# Patient Record
Sex: Female | Born: 1955 | Race: White | Hispanic: No | Smoking: Never smoker
Health system: Southern US, Community
[De-identification: ages and names within clinical notes are randomized; demographics above are authoritative.]

## PROBLEM LIST (undated history)

## (undated) DIAGNOSIS — K219 Gastro-esophageal reflux disease without esophagitis: Secondary | ICD-10-CM

## (undated) DIAGNOSIS — E119 Type 2 diabetes mellitus without complications: Secondary | ICD-10-CM

## (undated) DIAGNOSIS — M8580 Other specified disorders of bone density and structure, unspecified site: Secondary | ICD-10-CM

## (undated) DIAGNOSIS — F419 Anxiety disorder, unspecified: Secondary | ICD-10-CM

## (undated) DIAGNOSIS — E079 Disorder of thyroid, unspecified: Secondary | ICD-10-CM

---

## 2020-04-13 ENCOUNTER — Other Ambulatory Visit: Payer: Self-pay

## 2020-04-13 ENCOUNTER — Encounter: Payer: Self-pay | Admitting: Emergency Medicine

## 2020-04-13 ENCOUNTER — Emergency Department (INDEPENDENT_AMBULATORY_CARE_PROVIDER_SITE_OTHER)
Admission: EM | Admit: 2020-04-13 | Discharge: 2020-04-13 | Disposition: A | Discharge status: Home / Self Care | Payer: BC Managed Care – PPO | Source: Home / Self Care | Attending: Family Medicine | Admitting: Family Medicine

## 2020-04-13 ENCOUNTER — Emergency Department (INDEPENDENT_AMBULATORY_CARE_PROVIDER_SITE_OTHER): Payer: BC Managed Care – PPO

## 2020-04-13 DIAGNOSIS — M25531 Pain in right wrist: Secondary | ICD-10-CM

## 2020-04-13 DIAGNOSIS — E1140 Type 2 diabetes mellitus with diabetic neuropathy, unspecified: Secondary | ICD-10-CM | POA: Insufficient documentation

## 2020-04-13 DIAGNOSIS — W19XXXA Unspecified fall, initial encounter: Secondary | ICD-10-CM | POA: Diagnosis not present

## 2020-04-13 DIAGNOSIS — E039 Hypothyroidism, unspecified: Secondary | ICD-10-CM | POA: Insufficient documentation

## 2020-04-13 DIAGNOSIS — E538 Deficiency of other specified B group vitamins: Secondary | ICD-10-CM | POA: Insufficient documentation

## 2020-04-13 DIAGNOSIS — E119 Type 2 diabetes mellitus without complications: Secondary | ICD-10-CM | POA: Insufficient documentation

## 2020-04-13 DIAGNOSIS — E785 Hyperlipidemia, unspecified: Secondary | ICD-10-CM | POA: Insufficient documentation

## 2020-04-13 DIAGNOSIS — S52571A Other intraarticular fracture of lower end of right radius, initial encounter for closed fracture: Secondary | ICD-10-CM

## 2020-04-13 DIAGNOSIS — F329 Major depressive disorder, single episode, unspecified: Secondary | ICD-10-CM | POA: Insufficient documentation

## 2020-04-13 DIAGNOSIS — U071 COVID-19: Secondary | ICD-10-CM | POA: Insufficient documentation

## 2020-04-13 HISTORY — DX: Gastro-esophageal reflux disease without esophagitis: K21.9

## 2020-04-13 HISTORY — DX: Anxiety disorder, unspecified: F41.9

## 2020-04-13 HISTORY — DX: Type 2 diabetes mellitus without complications: E11.9

## 2020-04-13 HISTORY — DX: Other specified disorders of bone density and structure, unspecified site: M85.80

## 2020-04-13 HISTORY — DX: Disorder of thyroid, unspecified: E07.9

## 2020-04-13 MED ORDER — IBUPROFEN 600 MG PO TABS
600.0000 mg | ORAL_TABLET | Freq: Once | ORAL | Status: AC
  Administered 2020-04-13: 600 mg via ORAL

## 2020-04-13 MED ORDER — HYDROCODONE-ACETAMINOPHEN 7.5-325 MG PO TABS: 1.0000 | ORAL_TABLET | Freq: Four times a day (QID) | ORAL | 0 refills | Status: AC | PRN

## 2020-04-13 NOTE — Discharge Instructions (Signed)
Use ice for 20 minutes every couple of hours and elevate arm This will help reduce pain and swelling  Take Tylenol or ibuprofen or naproxen for mild to moderate pain Take hydrocodone if pain is severe  Leave on brace.  Wear sling as much as you can Call your orthopedic on Monday morning for an appointment next week

## 2020-04-13 NOTE — ED Provider Notes (Signed)
[facility]    CSN: [id number] Arrival date & time: 04/13/20  1600      History   Chief Complaint Chief Complaint  Patient presents with  . Wrist Pain    HPI Audrey Mccarty is a 64 y.o. female.   HPI  Patient fell backwards.  Landed on outstretched hand.  Has acute pain in the right wrist.  Is here for evaluation.  It is swollen.  Past Medical History:  Diagnosis Date  . Anxiety   . Diabetes mellitus without complication (HCC)   . GERD (gastroesophageal reflux disease)   . Osteopenia   . Thyroid disease     Patient Active Problem List   Diagnosis Date Noted  . Type 2 diabetes mellitus (HCC) 04/13/2020  . Acquired hypothyroidism 04/13/2020  . Diabetic neuropathy (HCC) 04/13/2020  . B12 deficiency 04/13/2020  . Major depression 04/13/2020  . Hyperlipidemia 04/13/2020  . Pneumonia due to COVID-19 virus 04/13/2020    History reviewed. No pertinent surgical history.  OB History   No obstetric history on file.      Home Medications    Prior to Admission medications   Medication Sig Start Date End Date Taking? Authorizing Provider  aspirin EC 81 MG tablet Take 81 mg by mouth daily. Swallow whole.   Yes [provider]  FLUoxetine (PROZAC) 20 MG capsule Take 20 mg by mouth daily.   Yes [provider]  HYDROcodone-acetaminophen (NORCO) 7.5-325 MG tablet Take 1 tablet by mouth every 6 (six) hours as needed for moderate pain or severe pain. 04/13/20  Yes Eustace Moore, MD  levothyroxine (SYNTHROID) 112 MCG tablet Take 112 mcg by mouth daily before breakfast.   Yes [provider]  losartan (COZAAR) 25 MG tablet Take 25 mg by mouth daily.   Yes [provider]  metFORMIN (GLUCOPHAGE) 1000 MG tablet Take 2,000 mg by mouth daily with breakfast.   Yes [provider]  omeprazole (PRILOSEC) 20 MG capsule Take 30 mg by mouth daily.   Yes [provider]  Semaglutide,0.25 or 0.5MG /DOS, (OZEMPIC, 0.25 OR  0.5 MG/DOSE,) 2 MG/1.5ML SOPN Inject into the skin.   Yes [provider]  simvastatin (ZOCOR) 20 MG tablet Take 20 mg by mouth daily.   Yes [provider]    Family History No family history on file.  Social History Social History   Tobacco Use  . Smoking status: Never Smoker  . Smokeless tobacco: Never Used  Substance Use Topics  . Alcohol use: Never     Allergies   Patient has no allergy information on record.   Review of Systems Review of Systems  See HPI Physical Exam Triage Vital Signs  BP 92/62 (BP Location: Left Arm) Comment: states runs low blood pressure  Pulse 70   Temp 98.3 F (36.8 C) (Oral)   Resp 16   Ht 5' 6.5" (1.689 m)   Wt 99.8 kg   SpO2 100%   BMI 34.98 kg/m      Physical Exam Constitutional:      General: She is not in acute distress.    Appearance: She is well-developed.     Comments: In obvious discomfort.  Pleasant, overweight  HENT:     Head: Normocephalic and atraumatic.     Mouth/Throat:     Comments: Mask in place Eyes:     Conjunctiva/sclera: Conjunctivae normal.     Pupils: Pupils are equal, round, and reactive to light.  Cardiovascular:     Rate  and Rhythm: Normal rate.  Pulmonary:     Effort: Pulmonary effort is normal. No respiratory distress.  Abdominal:     General: There is no distension.     Palpations: Abdomen is soft.  Musculoskeletal:        General: Normal range of motion.     Cervical back: Normal range of motion.     Comments: Swelling and tenderness over distal radius.  ROM causes pain.  Finger mobility normal.  Skin:    General: Skin is warm and dry.  Neurological:     General: No focal deficit present.     Mental Status: She is alert.     Sensory: No sensory deficit.  Psychiatric:        Behavior: Behavior normal.      UC Treatments / Results  Labs (all labs ordered are listed, but only abnormal results are displayed) Labs Reviewed - No data to  display  EKG   Radiology DG Wrist Complete Right  Result Date: 04/13/2020 CLINICAL DATA:  Larey Seat today landing on RIGHT wrist, wrist pain radiating upper towards forearm, limited range of motion EXAM: RIGHT WRIST - COMPLETE 3+ VIEW COMPARISON:  None FINDINGS: Osseous demineralization. Joint spaces preserved. Comminuted metaphyseal fracture distal RIGHT radius with intra-articular extension at radiocarpal joint and minimal displacement. Neutral tilt of distal radial articular surface. No additional fracture, dislocation, or bone destruction. IMPRESSION: Comminuted minimally displaced intra-articular distal RIGHT radial metaphyseal fracture. Electronically Signed   By: Ulyses Southward M.D.   On: 04/13/2020 16:44    Procedures Splint Application  Date/Time: 04/14/2020 8:20 AM Performed by: Eustace Moore, MD Authorized by: Eustace Moore, MD   Consent:    Consent obtained:  Verbal   Consent given by:  Patient   Risks, benefits, and alternatives were discussed: not applicable     Alternatives discussed:  Referral Universal protocol:    Patient identity confirmed:  Verbally with patient and arm band Pre-procedure details:    Distal neurologic exam:  Normal   Distal perfusion: brisk capillary refill   Procedure details:    Location:  Wrist   Wrist location:  R wrist   Strapping: no     Splint type:  Volar short arm   Supplies:  Fiberglass   Attestation: Splint applied and adjusted personally by me   Post-procedure details:    Distal neurologic exam:  Normal   Distal perfusion: brisk capillary refill     Procedure completion:  Tolerated well, no immediate complications   Post-procedure imaging: not applicable     (including critical care time)  Medications Ordered in UC Medications  ibuprofen (ADVIL) tablet 600 mg (600 mg Oral Given 04/13/20 1621)    Initial Impression / Assessment and Plan / UC Course  I have reviewed the triage vital signs and the nursing  notes.  Pertinent labs & imaging results that were available during my care of the patient were reviewed by me and considered in my medical decision making (see chart for details).     Patient placed in molded splint.  Sling for immobilization.   Final Clinical Impressions(s) / UC Diagnoses   Final diagnoses:  Other closed intra-articular fracture of distal end of right radius, initial encounter  Fall, initial encounter     Discharge Instructions     Use ice for 20 minutes every couple of hours and elevate arm This will help reduce pain and swelling  Take Tylenol or ibuprofen or naproxen for mild to moderate pain Take  hydrocodone if pain is severe  Leave on brace.  Wear sling as much as you can Call your orthopedic on Monday morning for an appointment next week    ED Prescriptions    Medication Sig Dispense Auth. Provider   HYDROcodone-acetaminophen (NORCO) 7.5-325 MG tablet Take 1 tablet by mouth every 6 (six) hours as needed for moderate pain or severe pain. 15 tablet Eustace Moore, MD     I have reviewed the PDMP during this encounter.   Eustace Moore, MD 04/14/20 [phone]

## 2020-04-13 NOTE — ED Triage Notes (Signed)
Patient here for wrist injury within past one hour; tripped over cord and fell landing on palm of right hand; right wrist is now edematous and painful to move. Has had covid and was hospitalized in 12/21; no follow up vaccinations. Has not taken OTC since fall.

## 2022-08-28 IMAGING — DX DG WRIST COMPLETE 3+V*R*
4 series · 4 of 4 positions shown · non-contrast
Comparison: None

CLINICAL DATA: Fell today landing on RIGHT wrist, wrist pain
radiating upper towards forearm, limited range of motion

EXAM:
RIGHT WRIST - COMPLETE 3+ VIEW

[wrist pa]
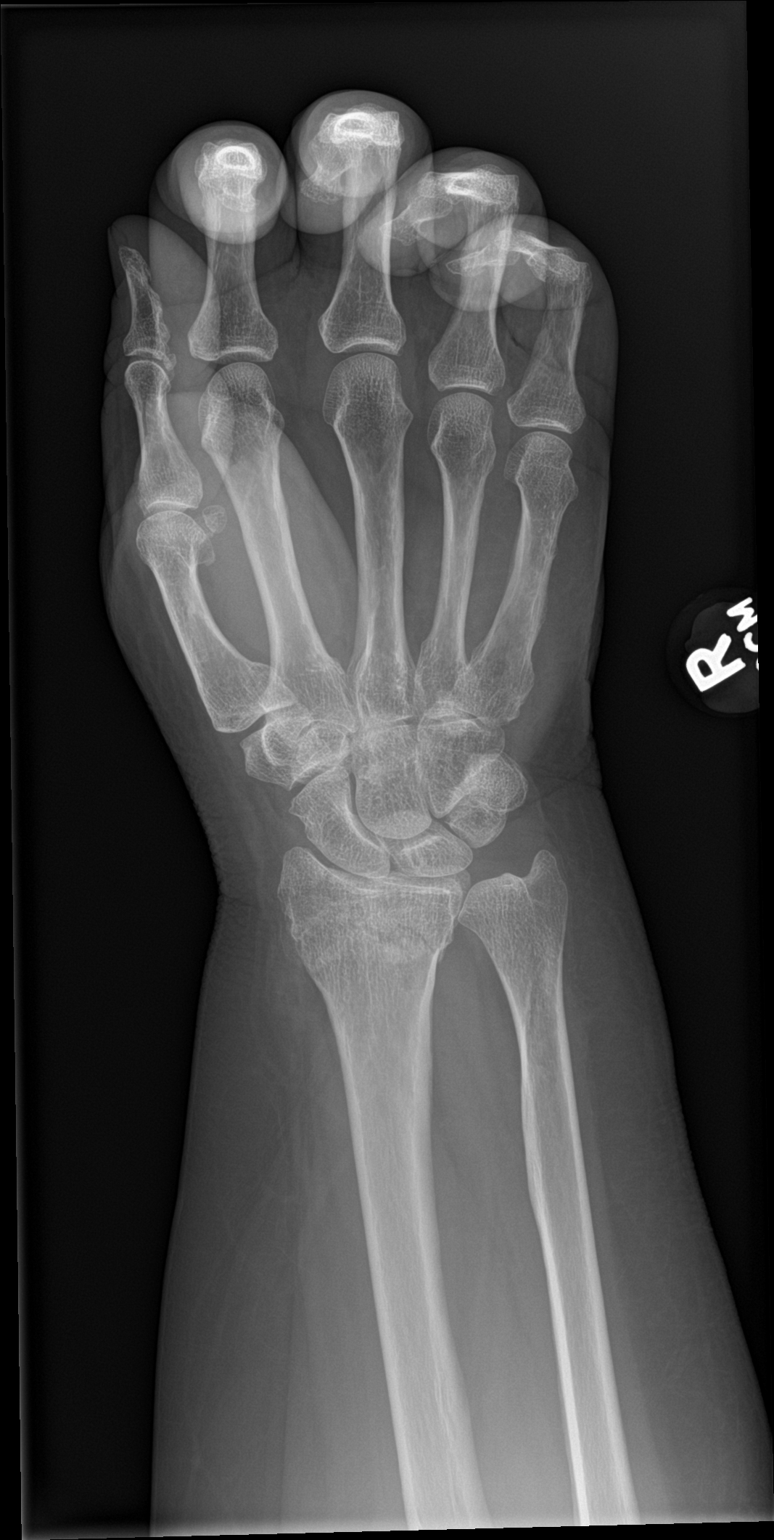

[wrist obl]
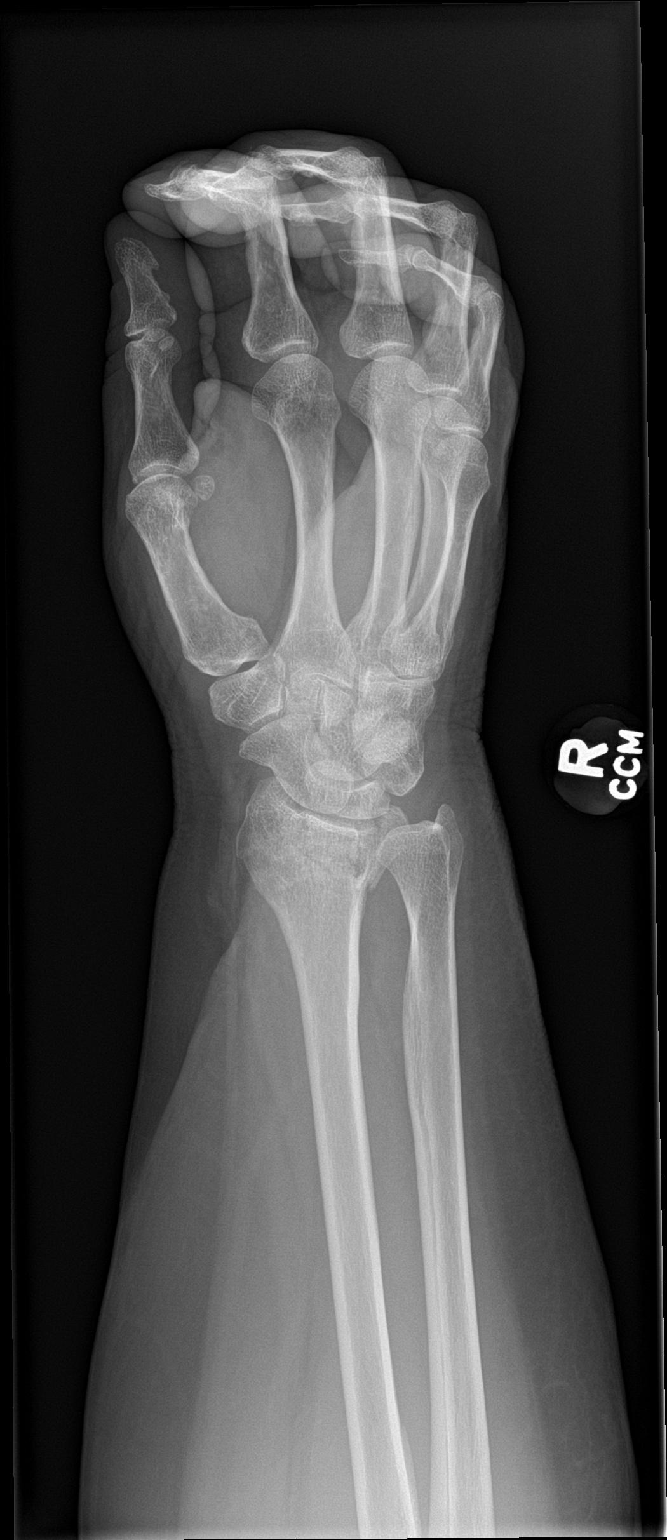

[wrist lat]
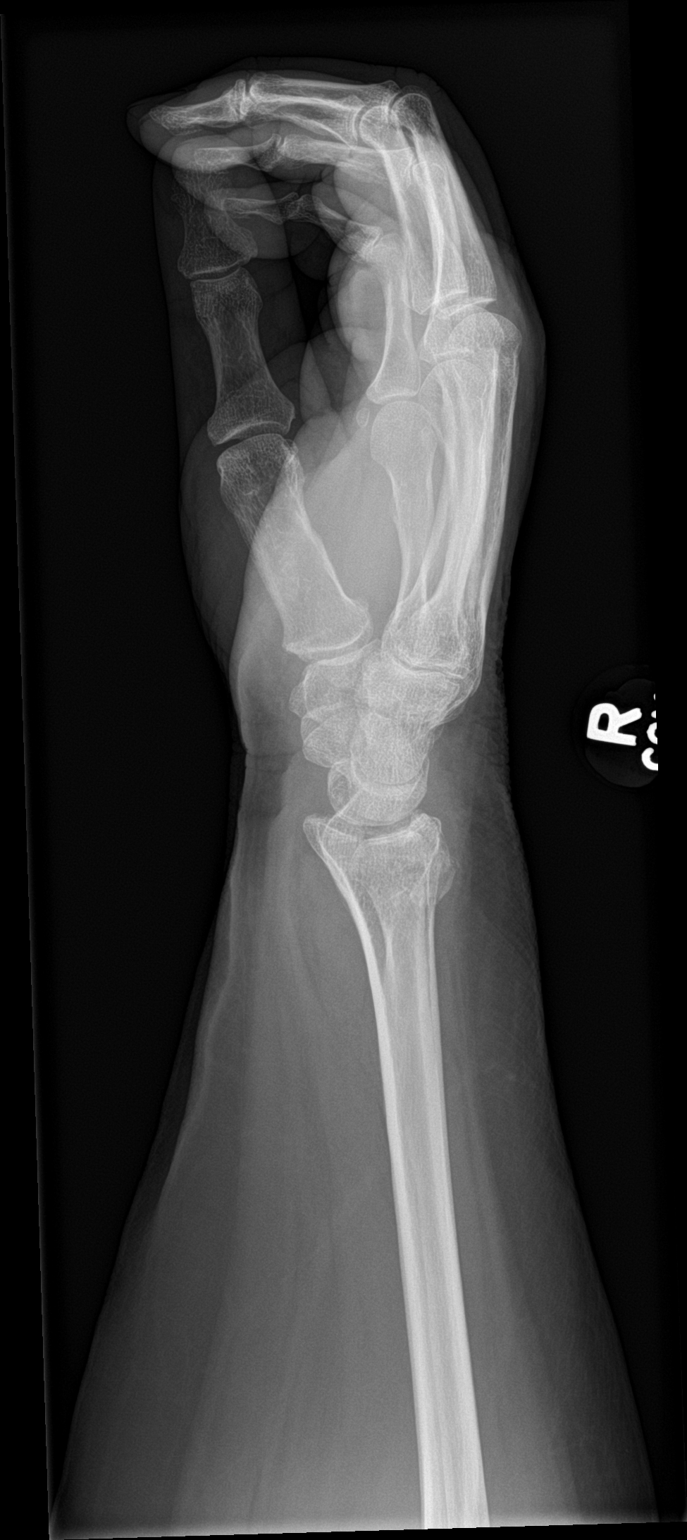

[wrist navicular]
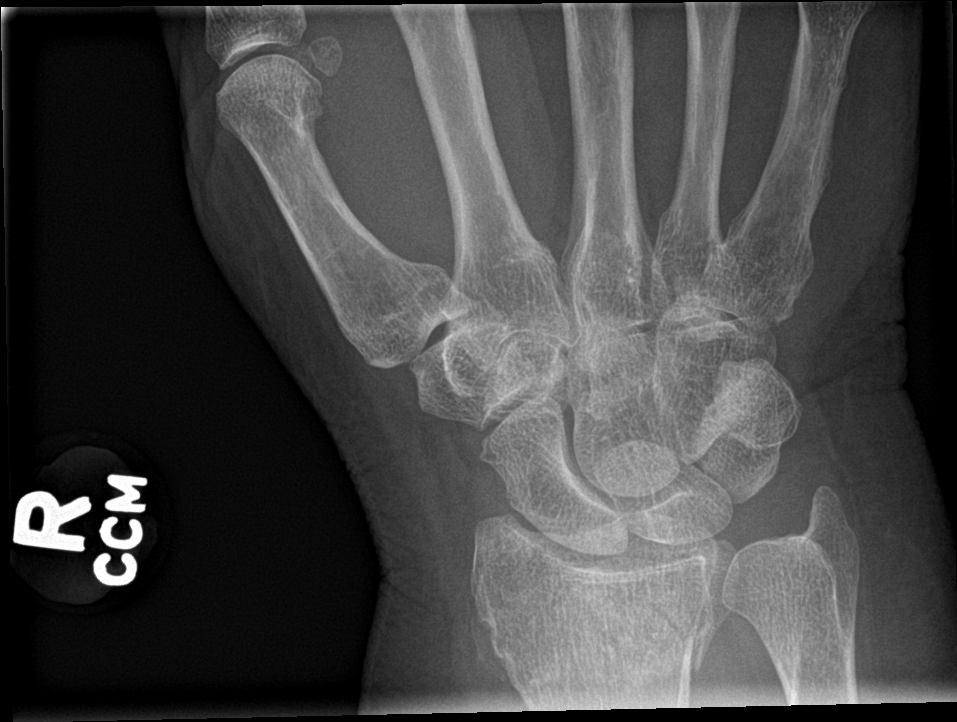

[4 of 4 positions shown; findings below may reference images not displayed]

FINDINGS: Osseous demineralization.

Joint spaces preserved.

Comminuted metaphyseal fracture distal RIGHT radius with
intra-articular extension at radiocarpal joint and minimal
displacement.

Neutral tilt of distal radial articular surface.

No additional fracture, dislocation, or bone destruction.
IMPRESSION: Comminuted minimally displaced intra-articular distal RIGHT radial
metaphyseal fracture.
